# Patient Record
Sex: Female | Born: 1980 | Race: White | Hispanic: No | Marital: Married | Smoking: Never smoker
Health system: Southern US, Community
[De-identification: ages and names within clinical notes are randomized; demographics above are authoritative.]

## PROBLEM LIST (undated history)

## (undated) DIAGNOSIS — F419 Anxiety disorder, unspecified: Secondary | ICD-10-CM

## (undated) DIAGNOSIS — Q513 Bicornate uterus: Secondary | ICD-10-CM

## (undated) DIAGNOSIS — E039 Hypothyroidism, unspecified: Secondary | ICD-10-CM

## (undated) DIAGNOSIS — K219 Gastro-esophageal reflux disease without esophagitis: Secondary | ICD-10-CM

## (undated) HISTORY — DX: Bicornate uterus: Q51.3

## (undated) HISTORY — DX: Anxiety disorder, unspecified: F41.9

## (undated) HISTORY — PX: WISDOM TOOTH EXTRACTION: SHX21

## (undated) HISTORY — DX: Hypothyroidism, unspecified: E03.9

---

## 2006-02-13 ENCOUNTER — Inpatient Hospital Stay (HOSPITAL_COMMUNITY): Admission: AD | Admit: 2006-02-13 | Discharge: 2006-02-13 | Payer: Self-pay | Admitting: Obstetrics and Gynecology

## 2006-05-04 ENCOUNTER — Inpatient Hospital Stay (HOSPITAL_COMMUNITY): Admission: AD | Admit: 2006-05-04 | Discharge: 2006-05-04 | Payer: Self-pay | Admitting: Obstetrics and Gynecology

## 2006-05-11 ENCOUNTER — Inpatient Hospital Stay (HOSPITAL_COMMUNITY): Admission: AD | Admit: 2006-05-11 | Discharge: 2006-05-14 | Payer: Self-pay | Admitting: Obstetrics and Gynecology

## 2009-05-31 ENCOUNTER — Inpatient Hospital Stay (HOSPITAL_COMMUNITY): Admission: AD | Admit: 2009-05-31 | Discharge: 2009-06-01 | Payer: Self-pay | Admitting: Obstetrics and Gynecology

## 2009-06-06 ENCOUNTER — Ambulatory Visit (HOSPITAL_COMMUNITY): Admission: RE | Admit: 2009-06-06 | Discharge: 2009-06-06 | Payer: Self-pay | Admitting: Anesthesiology

## 2010-04-09 LAB — RH IMMUNE GLOB WKUP(>/=20WKS)(NOT WOMEN'S HOSP): Fetal Screen: NEGATIVE

## 2010-04-09 LAB — CBC
HCT: 35.3 % — ABNORMAL LOW (ref 36.0–46.0)
HCT: 40.8 % (ref 36.0–46.0)
Hemoglobin: 12.2 g/dL (ref 12.0–15.0)
Hemoglobin: 14.1 g/dL (ref 12.0–15.0)
MCHC: 34.6 g/dL (ref 30.0–36.0)
MCV: 91.3 fL (ref 78.0–100.0)
MCV: 91.9 fL (ref 78.0–100.0)
RBC: 4.47 MIL/uL (ref 3.87–5.11)
RDW: 13.8 % (ref 11.5–15.5)
WBC: 14.1 10*3/uL — ABNORMAL HIGH (ref 4.0–10.5)

## 2010-04-09 LAB — RPR: RPR Ser Ql: NONREACTIVE

## 2010-06-07 NOTE — Discharge Summary (Signed)
NAME:  Jessica Velez, Jessica Velez            ACCOUNT NO.:  [id number]   MEDICAL RECORD NO.:  [id number]          PATIENT TYPE:  INP   LOCATION:  9115                          FACILITY:  [facility]   PHYSICIAN:  Zenaida Niece, M.D.DATE OF BIRTH:  05-20-1980   DATE OF ADMISSION:  05/11/2006  DATE OF DISCHARGE:                               DISCHARGE SUMMARY   ADMISSION DIAGNOSES:  1. Intrauterine intrauterine pregnancy at 37+ weeks.  2. Group B streptococcus carrier.  3. 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  4. Hypothyroidism.   DISCHARGE DIAGNOSES:  1. Intrauterine intrauterine pregnancy at 37+ weeks.  2. Group B streptococcus carrier.  3. 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  4. Hypothyroidism.   PROCEDURES:  On May 12, 2006, she had a spontaneous vaginal delivery.   HISTORY AND PHYSICAL:  This is a 30 year old white female gravida 1,  para 0 with an EGA of 37+ weeks who presents with a complaint of regular  contractions.  Evaluation in triage revealed cervix to be 3 cm dilated  and this changed to 4 cm dilated after walking.  Prenatal care  complicated by a possible bicornuate uterus and otherwise uncomplicated.  Prenatal labs:  Blood type is O negative with a negative antibody  screen, RPR nonreactive, rubella immune, hepatitis B surface antigen  negative, gonorrhea and chlamydia negative, AFP is normal, 1-hour  Glucola is 122 and group B strep is positive.  Past medical history  significant for hypothyroidism and anxiety.  Medications: Levothyroxine  112 mcg daily.  Physical exam:  She is afebrile with stable vital signs.  Fetal heart tracing is reactive with regular contractions.  Abdomen  gravid, nontender, with an estimated fetal weight 7 pounds.  Cervix on  my first exam:  She is complete, complete and +3 with a vertex  presentation and an adequate pelvis.   HOSPITAL COURSE:  The patient was admitted and observed and put on  penicillin for group B strep prophylaxis.  Despite continued regular  contractions, her cervix did not change.  She was put on Pitocin  augmentation.  She received an epidural and had spontaneous rupture of  membranes.  She progressed to complete, pushed well, and on the morning  of April22 had a vaginal delivery of a viable female infant with  Apgars of 8 and 9 that weighed 6 pounds 11 ounces.  Placenta delivered  spontaneous, was intact, and was sent for cord blood collection.  She  had a second-degree laceration repaired with 3-0 Vicryl with local  block, and a small right labial laceration which was hemostatic and not  repaired.  The uterus did palpate as a possible bicornuate uterus.  Estimated blood loss was 500 mL.  Postpartum, she had no significant  complications.  Predelivery hemoglobin 13.4, postdelivery is 12, and on  postpartum #2 she was felt to be stable enough for discharge home.   DISCHARGE INSTRUCTIONS:  Regular diet, pelvic rest, followup in 6 weeks.  Medications are over-the-counter ibuprofen as needed, and she is given  our discharge pamphlet.      Zenaida Niece, M.D.  Electronically Signed     TDM/MEDQ  D:  05/14/2006  T:  05/14/2006  Job:  [id number]

## 2010-09-18 ENCOUNTER — Other Ambulatory Visit (HOSPITAL_COMMUNITY): Payer: Self-pay | Admitting: Obstetrics and Gynecology

## 2010-09-18 DIAGNOSIS — O3680X0 Pregnancy with inconclusive fetal viability, not applicable or unspecified: Secondary | ICD-10-CM

## 2010-09-19 ENCOUNTER — Ambulatory Visit (HOSPITAL_COMMUNITY): Payer: Self-pay

## 2010-09-19 ENCOUNTER — Other Ambulatory Visit (HOSPITAL_COMMUNITY): Payer: Self-pay

## 2010-11-04 ENCOUNTER — Other Ambulatory Visit (HOSPITAL_COMMUNITY): Payer: Self-pay | Admitting: Obstetrics and Gynecology

## 2010-11-04 DIAGNOSIS — Z369 Encounter for antenatal screening, unspecified: Secondary | ICD-10-CM

## 2010-11-04 LAB — ABO/RH: RH Type: NEGATIVE

## 2010-11-04 LAB — GC/CHLAMYDIA PROBE AMP, GENITAL: Gonorrhea: NEGATIVE

## 2010-11-04 LAB — RPR: RPR: NONREACTIVE

## 2010-11-04 LAB — HEPATITIS B SURFACE ANTIGEN: Hepatitis B Surface Ag: NEGATIVE

## 2010-11-05 ENCOUNTER — Encounter (HOSPITAL_COMMUNITY): Payer: Self-pay

## 2010-11-05 ENCOUNTER — Ambulatory Visit (HOSPITAL_COMMUNITY)
Admission: RE | Admit: 2010-11-05 | Discharge: 2010-11-05 | Disposition: A | Discharge status: Home / Self Care | Payer: BC Managed Care – PPO | Source: Ambulatory Visit | Attending: Obstetrics and Gynecology | Admitting: Obstetrics and Gynecology

## 2010-11-05 ENCOUNTER — Other Ambulatory Visit: Payer: Self-pay

## 2010-11-05 DIAGNOSIS — O351XX0 Maternal care for (suspected) chromosomal abnormality in fetus, not applicable or unspecified: Secondary | ICD-10-CM | POA: Insufficient documentation

## 2010-11-05 DIAGNOSIS — Z369 Encounter for antenatal screening, unspecified: Secondary | ICD-10-CM

## 2010-11-05 DIAGNOSIS — Z3689 Encounter for other specified antenatal screening: Secondary | ICD-10-CM | POA: Insufficient documentation

## 2010-11-05 DIAGNOSIS — O3510X0 Maternal care for (suspected) chromosomal abnormality in fetus, unspecified, not applicable or unspecified: Secondary | ICD-10-CM | POA: Insufficient documentation

## 2010-11-05 NOTE — Progress Notes (Signed)
Obstetric ultrasound performed today for nuchal translucency.  Please see report in [facility].

## 2011-01-21 NOTE — L&D Delivery Note (Signed)
Delivery Note At 2:28 AM a viable female was delivered via Vaginal, Spontaneous Delivery (Presentation: Right Occiput Anterior).  APGAR: 9, 9; weight P .   Placenta status: Intact, Spontaneous.  Cord: 3 vessels with the following complications: None.    Anesthesia: Local Epidural none Episiotomy: None Lacerations: 1st degree;Labial;2nd degree;Perineal Suture Repair: 3.0 vicryl rapide Est. Blood Loss (mL): 500  Mom to postpartum.  Baby to nursery-stable.  BOVARD,Yanelly Cantrelle 05/03/2011, 3:28 AM  Br/O neg/Vasectomy

## 2011-04-11 LAB — STREP B DNA PROBE: GBS: NEGATIVE

## 2011-04-29 ENCOUNTER — Encounter (HOSPITAL_COMMUNITY): Payer: Self-pay | Admitting: *Deleted

## 2011-04-29 ENCOUNTER — Telehealth (HOSPITAL_COMMUNITY): Payer: Self-pay | Admitting: *Deleted

## 2011-04-29 NOTE — Telephone Encounter (Signed)
Preadmission screen  

## 2011-04-30 ENCOUNTER — Encounter (HOSPITAL_COMMUNITY): Payer: Self-pay | Admitting: *Deleted

## 2011-04-30 ENCOUNTER — Telehealth (HOSPITAL_COMMUNITY): Payer: Self-pay | Admitting: *Deleted

## 2011-04-30 NOTE — Telephone Encounter (Signed)
Preadmission screen  

## 2011-05-02 ENCOUNTER — Inpatient Hospital Stay (HOSPITAL_COMMUNITY)
Admission: AD | Admit: 2011-05-02 | Discharge: 2011-05-04 | DRG: 373 | Disposition: A | Discharge status: Home / Self Care | Payer: BC Managed Care – PPO | Source: Ambulatory Visit | Attending: Obstetrics and Gynecology | Admitting: Obstetrics and Gynecology

## 2011-05-02 DIAGNOSIS — Q513 Bicornate uterus: Secondary | ICD-10-CM | POA: Diagnosis present

## 2011-05-02 DIAGNOSIS — O99284 Endocrine, nutritional and metabolic diseases complicating childbirth: Secondary | ICD-10-CM | POA: Diagnosis present

## 2011-05-02 DIAGNOSIS — E079 Disorder of thyroid, unspecified: Secondary | ICD-10-CM | POA: Diagnosis present

## 2011-05-02 DIAGNOSIS — E039 Hypothyroidism, unspecified: Secondary | ICD-10-CM | POA: Diagnosis present

## 2011-05-02 DIAGNOSIS — O3400 Maternal care for unspecified congenital malformation of uterus, unspecified trimester: Secondary | ICD-10-CM | POA: Diagnosis present

## 2011-05-03 ENCOUNTER — Encounter (HOSPITAL_COMMUNITY): Payer: Self-pay

## 2011-05-03 ENCOUNTER — Encounter (HOSPITAL_COMMUNITY): Payer: Self-pay | Admitting: Anesthesiology

## 2011-05-03 ENCOUNTER — Inpatient Hospital Stay (HOSPITAL_COMMUNITY): Payer: BC Managed Care – PPO | Admitting: Anesthesiology

## 2011-05-03 LAB — CBC
HCT: 41 % (ref 36.0–46.0)
MCV: 90.1 fL (ref 78.0–100.0)
RBC: 4.55 MIL/uL (ref 3.87–5.11)
RDW: 13.9 % (ref 11.5–15.5)
WBC: 10.3 10*3/uL (ref 4.0–10.5)

## 2011-05-03 MED ORDER — FENTANYL 2.5 MCG/ML BUPIVACAINE 1/10 % EPIDURAL INFUSION (WH - ANES)
14.0000 mL/h | INTRAMUSCULAR | Status: DC
  Filled 2011-05-03: qty 60

## 2011-05-03 MED ORDER — BUTORPHANOL TARTRATE 2 MG/ML IJ SOLN: 2.0000 mg | INTRAMUSCULAR | Status: DC | PRN

## 2011-05-03 MED ORDER — TERBUTALINE SULFATE 1 MG/ML IJ SOLN: 0.2500 mg | Freq: Once | INTRAMUSCULAR | Status: DC | PRN

## 2011-05-03 MED ORDER — DIPHENHYDRAMINE HCL 50 MG/ML IJ SOLN: 12.5000 mg | INTRAMUSCULAR | Status: DC | PRN

## 2011-05-03 MED ORDER — CITRIC ACID-SODIUM CITRATE 334-500 MG/5ML PO SOLN: 30.0000 mL | ORAL | Status: DC | PRN

## 2011-05-03 MED ORDER — IBUPROFEN 600 MG PO TABS
600.0000 mg | ORAL_TABLET | Freq: Four times a day (QID) | ORAL | Status: DC
  Administered 2011-05-03 – 2011-05-04 (×5): 600 mg via ORAL
  Filled 2011-05-03 (×5): qty 1

## 2011-05-03 MED ORDER — PHENYLEPHRINE 40 MCG/ML (10ML) SYRINGE FOR IV PUSH (FOR BLOOD PRESSURE SUPPORT): 80.0000 ug | PREFILLED_SYRINGE | INTRAVENOUS | Status: DC | PRN

## 2011-05-03 MED ORDER — ZOLPIDEM TARTRATE 5 MG PO TABS: 5.0000 mg | ORAL_TABLET | Freq: Every evening | ORAL | Status: DC | PRN

## 2011-05-03 MED ORDER — LEVOTHYROXINE SODIUM 112 MCG PO TABS
112.0000 ug | ORAL_TABLET | Freq: Every day | ORAL | Status: DC
  Administered 2011-05-03 – 2011-05-04 (×2): 112 ug via ORAL
  Filled 2011-05-03 (×2): qty 1

## 2011-05-03 MED ORDER — EPHEDRINE 5 MG/ML INJ
10.0000 mg | INTRAVENOUS | Status: DC | PRN
  Filled 2011-05-03: qty 4

## 2011-05-03 MED ORDER — RHO D IMMUNE GLOBULIN 1500 UNIT/2ML IJ SOLN
300.0000 ug | Freq: Once | INTRAMUSCULAR | Status: AC
  Administered 2011-05-03: 300 ug via INTRAMUSCULAR
  Filled 2011-05-03: qty 2

## 2011-05-03 MED ORDER — ACETAMINOPHEN 325 MG PO TABS: 650.0000 mg | ORAL_TABLET | ORAL | Status: DC | PRN

## 2011-05-03 MED ORDER — LIDOCAINE HCL (PF) 1 % IJ SOLN
INTRAMUSCULAR | Status: DC | PRN
  Administered 2011-05-03: 4 mL
  Administered 2011-05-03: 30 mL
  Administered 2011-05-03: 4 mL

## 2011-05-03 MED ORDER — BENZOCAINE-MENTHOL 20-0.5 % EX AERO
INHALATION_SPRAY | CUTANEOUS | Status: AC
  Administered 2011-05-03: 06:00:00
  Filled 2011-05-03: qty 56

## 2011-05-03 MED ORDER — SENNOSIDES-DOCUSATE SODIUM 8.6-50 MG PO TABS
2.0000 | ORAL_TABLET | Freq: Every day | ORAL | Status: DC
  Administered 2011-05-03: 2 via ORAL

## 2011-05-03 MED ORDER — DIBUCAINE 1 % RE OINT: 1.0000 "application " | TOPICAL_OINTMENT | RECTAL | Status: DC | PRN

## 2011-05-03 MED ORDER — SIMETHICONE 80 MG PO CHEW: 80.0000 mg | CHEWABLE_TABLET | ORAL | Status: DC | PRN

## 2011-05-03 MED ORDER — LACTATED RINGERS IV SOLN
500.0000 mL | Freq: Once | INTRAVENOUS | Status: AC
  Administered 2011-05-03: 500 mL via INTRAVENOUS

## 2011-05-03 MED ORDER — OXYTOCIN 20 UNITS IN LACTATED RINGERS INFUSION - SIMPLE: 1.0000 m[IU]/min | INTRAVENOUS | Status: DC

## 2011-05-03 MED ORDER — IBUPROFEN 600 MG PO TABS: 600.0000 mg | ORAL_TABLET | Freq: Four times a day (QID) | ORAL | Status: DC | PRN

## 2011-05-03 MED ORDER — ONDANSETRON HCL 4 MG PO TABS: 4.0000 mg | ORAL_TABLET | ORAL | Status: DC | PRN

## 2011-05-03 MED ORDER — OXYCODONE-ACETAMINOPHEN 5-325 MG PO TABS: 1.0000 | ORAL_TABLET | ORAL | Status: DC | PRN

## 2011-05-03 MED ORDER — LACTATED RINGERS IV SOLN: INTRAVENOUS | Status: DC

## 2011-05-03 MED ORDER — OXYTOCIN BOLUS FROM INFUSION
500.0000 mL | Freq: Once | INTRAVENOUS | Status: AC
  Administered 2011-05-03: 500 mL via INTRAVENOUS
  Filled 2011-05-03: qty 1000
  Filled 2011-05-03: qty 500

## 2011-05-03 MED ORDER — FLEET ENEMA 7-19 GM/118ML RE ENEM: 1.0000 | ENEMA | RECTAL | Status: DC | PRN

## 2011-05-03 MED ORDER — PRENATAL MULTIVITAMIN CH: 1.0000 | ORAL_TABLET | Freq: Every day | ORAL | Status: DC

## 2011-05-03 MED ORDER — LIDOCAINE HCL (PF) 1 % IJ SOLN
30.0000 mL | INTRAMUSCULAR | Status: DC | PRN
  Filled 2011-05-03: qty 30

## 2011-05-03 MED ORDER — FENTANYL 2.5 MCG/ML BUPIVACAINE 1/10 % EPIDURAL INFUSION (WH - ANES)
INTRAMUSCULAR | Status: DC | PRN
  Administered 2011-05-03: 14 mL/h via EPIDURAL

## 2011-05-03 MED ORDER — LANOLIN HYDROUS EX OINT: TOPICAL_OINTMENT | CUTANEOUS | Status: DC | PRN

## 2011-05-03 MED ORDER — WITCH HAZEL-GLYCERIN EX PADS: 1.0000 "application " | MEDICATED_PAD | CUTANEOUS | Status: DC | PRN

## 2011-05-03 MED ORDER — PRENATAL MULTIVITAMIN CH
1.0000 | ORAL_TABLET | Freq: Every day | ORAL | Status: DC
  Administered 2011-05-03 – 2011-05-04 (×2): 1 via ORAL
  Filled 2011-05-03 (×2): qty 1

## 2011-05-03 MED ORDER — BENZOCAINE-MENTHOL 20-0.5 % EX AERO: 1.0000 "application " | INHALATION_SPRAY | CUTANEOUS | Status: DC | PRN

## 2011-05-03 MED ORDER — EPHEDRINE 5 MG/ML INJ: 10.0000 mg | INTRAVENOUS | Status: DC | PRN

## 2011-05-03 MED ORDER — PHENYLEPHRINE 40 MCG/ML (10ML) SYRINGE FOR IV PUSH (FOR BLOOD PRESSURE SUPPORT)
80.0000 ug | PREFILLED_SYRINGE | INTRAVENOUS | Status: DC | PRN
  Filled 2011-05-03 (×2): qty 5

## 2011-05-03 MED ORDER — TETANUS-DIPHTH-ACELL PERTUSSIS 5-2.5-18.5 LF-MCG/0.5 IM SUSP: 0.5000 mL | Freq: Once | INTRAMUSCULAR | Status: DC

## 2011-05-03 MED ORDER — OXYTOCIN 20 UNITS IN LACTATED RINGERS INFUSION - SIMPLE
125.0000 mL/h | Freq: Once | INTRAVENOUS | Status: DC
  Administered 2011-05-03: 125 mL/h via INTRAVENOUS

## 2011-05-03 MED ORDER — DIPHENHYDRAMINE HCL 25 MG PO CAPS: 25.0000 mg | ORAL_CAPSULE | Freq: Four times a day (QID) | ORAL | Status: DC | PRN

## 2011-05-03 MED ORDER — ONDANSETRON HCL 4 MG/2ML IJ SOLN: 4.0000 mg | INTRAMUSCULAR | Status: DC | PRN

## 2011-05-03 MED ORDER — ONDANSETRON HCL 4 MG/2ML IJ SOLN: 4.0000 mg | Freq: Four times a day (QID) | INTRAMUSCULAR | Status: DC | PRN

## 2011-05-03 MED ORDER — LACTATED RINGERS IV SOLN
INTRAVENOUS | Status: DC
  Administered 2011-05-03: 01:00:00 via INTRAVENOUS

## 2011-05-03 MED ORDER — LACTATED RINGERS IV SOLN
500.0000 mL | INTRAVENOUS | Status: DC | PRN
Start: 2011-05-03 — End: 2011-05-03

## 2011-05-03 NOTE — Progress Notes (Signed)
Post Partum Day 0 Subjective: no complaints, up ad lib, tolerating PO and nll lochia, pain controlled  Objective: Blood pressure 106/66, pulse 78, temperature 98.1 F (36.7 C), temperature source Oral, resp. rate 20, height 5' 4.25" (1.632 m), weight 81.251 kg (179 lb 2 oz), last menstrual period 07/27/2010, SpO2 96.00%, not currently breastfeeding.  Physical Exam:  General: alert and no distress Lochia: appropriate Uterine Fundus: firm   Basename 05/03/11 0030  HGB 13.9  HCT 41.0    Assessment/Plan: Plan for discharge tomorrow, Breastfeeding and Lactation consult routine care   LOS: 1 day   BOVARD,Terril Amaro 05/03/2011, 11:07 AM

## 2011-05-03 NOTE — Anesthesia Postprocedure Evaluation (Signed)
  Anesthesia Post-op Note  Patient: Jessica Velez  Procedure(s) Performed: * No procedures listed *  Patient Location: PACU and Mother/Baby  Anesthesia Type: Epidural  Level of Consciousness: awake, alert  and oriented  Airway and Oxygen Therapy: Patient Spontanous Breathing   Post-op Assessment: Patient's Cardiovascular Status Stable and Respiratory Function Stable  Post-op Vital Signs: stable  Complications: No apparent anesthesia complications

## 2011-05-03 NOTE — Anesthesia Procedure Notes (Signed)
Epidural Patient location during procedure: OB Start time: 05/03/2011 1:47 AM  Staffing Anesthesiologist: Maressa Apollo A. Performed by: anesthesiologist   Preanesthetic Checklist Completed: patient identified, site marked, surgical consent, pre-op evaluation, timeout performed, IV checked, risks and benefits discussed and monitors and equipment checked  Epidural Patient position: sitting Prep: site prepped and draped and DuraPrep Patient monitoring: continuous pulse ox and blood pressure Approach: midline Injection technique: LOR air  Needle:  Needle type: Tuohy  Needle gauge: 17 G Needle length: 9 cm Needle insertion depth: 5 cm cm Catheter type: closed end flexible Catheter size: 19 Gauge Catheter at skin depth: 10 cm Test dose: negative and Other  Assessment Events: blood not aspirated, injection not painful, no injection resistance, negative IV test and no paresthesia  Additional Notes Patient identified. Risks and benefits discussed including failed block, incomplete  Pain control, post dural puncture headache, nerve damage, paralysis, blood pressure Changes, nausea, vomiting, reactions to medications-both toxic and allergic and post Partum back pain. All questions were answered. Patient expressed understanding and wished to proceed. Sterile technique was used throughout procedure. Epidural site was Dressed with sterile barrier dressing. No paresthesias, signs of intravascular injection Or signs of intrathecal spread were encountered.  Patient was more comfortable after the epidural was dosed. Please see RN's note for documentation of vital signs and FHR which are stable.

## 2011-05-03 NOTE — MAU Note (Signed)
Pt states, " I've had a lot of pressure for the last four hours and a trickle since 11:15 pm which was green. I started having a few contractions at 10:30 and they have gotten a little more intense."

## 2011-05-03 NOTE — Anesthesia Preprocedure Evaluation (Signed)
Anesthesia Evaluation  Patient identified by MRN, date of birth, ID band Patient awake    Reviewed: Allergy & Precautions, H&P , Patient's Chart, lab work & pertinent test results  Airway Mallampati: III TM Distance: >3 FB Neck ROM: full    Dental No notable dental hx. (+) Teeth Intact   Pulmonary neg pulmonary ROS,  breath sounds clear to auscultation  Pulmonary exam normal       Cardiovascular negative cardio ROS  Rhythm:regular Rate:Normal     Neuro/Psych Anxiety negative neurological ROS     GI/Hepatic negative GI ROS, Neg liver ROS,   Endo/Other  Hypothyroidism   Renal/GU negative Renal ROS  negative genitourinary   Musculoskeletal   Abdominal Normal abdominal exam  (+)   Peds  Hematology negative hematology ROS (+)   Anesthesia Other Findings   Reproductive/Obstetrics (+) Pregnancy Bicornuate uterus                           Anesthesia Physical Anesthesia Plan  ASA: II  Anesthesia Plan: Epidural   Post-op Pain Management:    Induction:   Airway Management Planned:   Additional Equipment:   Intra-op Plan:   Post-operative Plan:   Informed Consent: I have reviewed the patients History and Physical, chart, labs and discussed the procedure including the risks, benefits and alternatives for the proposed anesthesia with the patient or authorized representative who has indicated his/her understanding and acceptance.     Plan Discussed with: Anesthesiologist  Anesthesia Plan Comments:         Anesthesia Quick Evaluation

## 2011-05-03 NOTE — H&P (Signed)
Queen Abbett is a 31 y.o. female [phone] at 38+ presents with SROM for thick meconium.  +FM, no VB, ctx increasing in intensity and frequency.  Uncomplicated prenatal care Maternal Medical History:  Reason for admission: Reason for admission: rupture of membranes.  Contractions: Onset was 3-5 hours ago.   Frequency: regular.    Fetal activity: Perceived fetal activity is normal.      OB History    Grav Para Term Preterm Abortions TAB SAB Ect Mult Living   4 3 2 1 1  0 1 0 0 3    G1 female SVD 6#11, G2 SAB, G3 7#5 SVD female, present; no abn pap, no STDs Past Medical History  Diagnosis Date  . Hypothyroidism   . Bicornate uterus   . Anxiety   . SVD (spontaneous vaginal delivery) 05/03/2011   Past Surgical History  Procedure Date  . No past surgeries    Family History: family history includes Cancer in her maternal grandfather; Heart disease in her maternal grandfather and paternal grandmother; Hypertension in her maternal grandfather; Seizures in her maternal grandfather; Stroke in her paternal grandfather; and Thyroid disease in her mother and paternal grandmother. Social History:  reports that she has never smoked. She has never used smokeless tobacco. She reports that she does not drink alcohol or use illicit drugs.married Meds levothyroxine, PNV All NKDA  Review of Systems  Constitutional: Negative.   HENT: Negative.   Eyes: Negative.   Respiratory: Negative.   Cardiovascular: Negative.   Gastrointestinal: Negative.   Genitourinary: Negative.   Musculoskeletal: Negative.   Skin: Negative.   Neurological: Negative.   Psychiatric/Behavioral: Negative.     Dilation: 4 Effacement (%): 80 Station: -1;0 Exam by:: l.poore, rn Blood pressure 120/71, pulse 76, temperature 98.1 F (36.7 C), temperature source Oral, resp. rate 20, height 5' 4.25" (1.632 m), weight 81.251 kg (179 lb 2 oz), last menstrual period 07/27/2010, SpO2 100.00%, not currently breastfeeding. Maternal  Exam:  Abdomen: Fundal height is appropriate for gestation.   Estimated fetal weight is 7#.   Fetal presentation: vertex     Physical Exam  Constitutional: She is oriented to person, place, and time. She appears well-developed and well-nourished.  HENT:  Head: Normocephalic and atraumatic.  Neck: Normal range of motion. Neck supple. No thyromegaly present.  Cardiovascular: Normal rate and regular rhythm.   Respiratory: Effort normal and breath sounds normal. No respiratory distress.  GI: Soft. Bowel sounds are normal. There is no tenderness.  Musculoskeletal: Normal range of motion.  Neurological: She is alert and oriented to person, place, and time.  Skin: Skin is warm and dry.  Psychiatric: She has a normal mood and affect. Her behavior is normal.    Prenatal labs: ABO, Rh: O/Negative/-- (10/15 0000) Antibody: Negative (10/15 0000) Rubella: Immune (10/15 0000) RPR: Nonreactive (10/15 0000)  HBsAg: Negative (10/15 0000)  HIV: Non-reactive (10/15 0000)  GBS: Negative (03/22 0000)  Hgb 13.7/Pap WNL/ Plt 222k/ TSH WNL/ GC neg/ Chl neg/ CF neg/ First Tri Screen and AFP WNL/ glucola 99/ gbbs neg  [place] 6wk dates pregnacy, sm subchorionic hmg  18wk nl anat x EIF, female, post plac Assessment/Plan: 30yo [id number] at 38+ with SROM Epidural for comfort Expect SVD   BOVARD,Cielo Arias 05/03/2011, 3:03 AM

## 2011-05-04 ENCOUNTER — Inpatient Hospital Stay (HOSPITAL_COMMUNITY): Admission: RE | Admit: 2011-05-04 | Payer: BC Managed Care – PPO | Source: Ambulatory Visit

## 2011-05-04 LAB — CBC
Hemoglobin: 12.4 g/dL (ref 12.0–15.0)
MCH: 30 pg (ref 26.0–34.0)
MCHC: 32.5 g/dL (ref 30.0–36.0)
MCV: 92.3 fL (ref 78.0–100.0)
RBC: 4.13 MIL/uL (ref 3.87–5.11)

## 2011-05-04 LAB — RH IG WORKUP (INCLUDES ABO/RH)
Fetal Screen: NEGATIVE
Gestational Age(Wks): 38.6
Unit division: 0

## 2011-05-04 MED ORDER — PRENATAL MULTIVITAMIN CH: 1.0000 | ORAL_TABLET | Freq: Every day | ORAL | Status: DC

## 2011-05-04 MED ORDER — IBUPROFEN 800 MG PO TABS: 800.0000 mg | ORAL_TABLET | Freq: Three times a day (TID) | ORAL | Status: AC | PRN

## 2011-05-04 NOTE — Progress Notes (Signed)
Post Partum Day 1 Subjective: no complaints, tolerating PO and nl lochai, pain controlled  Objective: Blood pressure 110/72, pulse 84, temperature 98.2 F (36.8 C), temperature source Oral, resp. rate 18, height 5' 4.25" (1.632 m), weight 81.251 kg (179 lb 2 oz), last menstrual period 07/27/2010, SpO2 96.00%, not currently breastfeeding.  Physical Exam:  General: alert and no distress Lochia: appropriate Uterine Fundus: firm    Basename 05/04/11 0521 05/03/11 0030  HGB 12.4 13.9  HCT 38.1 41.0    Assessment/Plan: Discharge home, Breastfeeding and Lactation consult  D/C with Motrin/PNV, f/u 6 wks   LOS: 2 days   BOVARD,Sherril Heyward 05/04/2011, 9:53 AM

## 2011-05-04 NOTE — Discharge Summary (Signed)
Obstetric Discharge Summary Reason for Admission: onset of labor Prenatal Procedures: none Intrapartum Procedures: spontaneous vaginal delivery Postpartum Procedures: none Complications-Operative and Postpartum: 2nd degree perineal laceration Hemoglobin  Date Value Range Status  05/04/2011 12.4  12.0-15.0 (g/dL) Final     HCT  Date Value Range Status  05/04/2011 38.1  36.0-46.0 (%) Final    Physical Exam:  General: alert and no distress Lochia: appropriate Uterine Fundus: firm  Discharge Diagnoses: Term Pregnancy-delivered  Discharge Information: Date: 05/04/2011 Activity: pelvic rest Diet: routine Medications: PNV and Ibuprofen Condition: stable Instructions: refer to practice specific booklet Discharge to: home Follow-up Information    Follow up with BOVARD,Jessica Zaccone, MD. Schedule an appointment as soon as possible for a visit in 6 weeks.   Contact information:   [address] [facility] [address] [place] [address] [phone]          Newborn Data: Live born female  Birth Weight: 6 lb 12.5 oz (3075 g) APGAR: 9, 9  Home with mother.  BOVARD,Jessica Velez 05/04/2011, 10:01 AM

## 2011-11-22 ENCOUNTER — Ambulatory Visit: Payer: Self-pay | Admitting: Internal Medicine

## 2011-11-22 LAB — URINALYSIS, COMPLETE
Glucose,UR: NEGATIVE mg/dL (ref 0–75)
Ketone: NEGATIVE
Nitrite: NEGATIVE
Specific Gravity: 1.005 (ref 1.003–1.030)

## 2011-11-24 LAB — URINE CULTURE

## 2011-12-10 ENCOUNTER — Ambulatory Visit: Payer: Self-pay | Admitting: Family Medicine

## 2013-11-21 ENCOUNTER — Encounter (HOSPITAL_COMMUNITY): Payer: Self-pay

## 2013-11-22 IMAGING — US THYROID ULTRASOUND
1 series · 14 of 25 positions shown · non-contrast
Comparison: none

REASON FOR EXAM: thyroid nodule
COMMENTS:

[Series 1: thyroid ultrasound · 0.08mm/px · 14 of 37 slices shown]
[im 1/37]
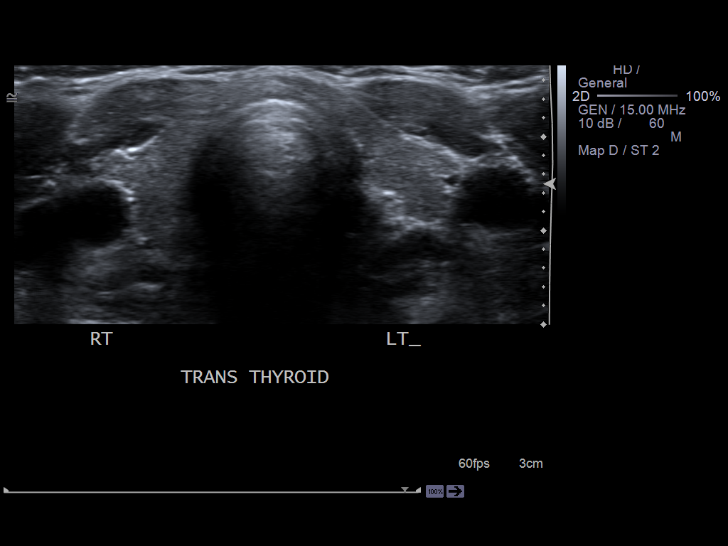
[im 4/37]
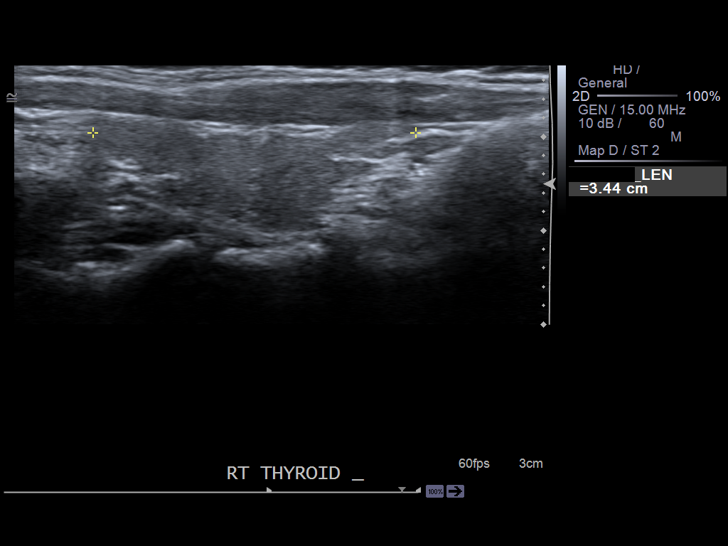
[im 7/37]
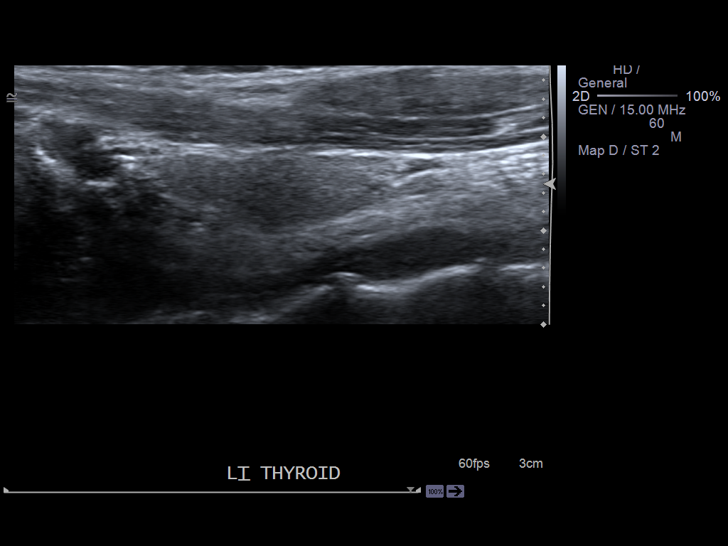
[im 10/37]
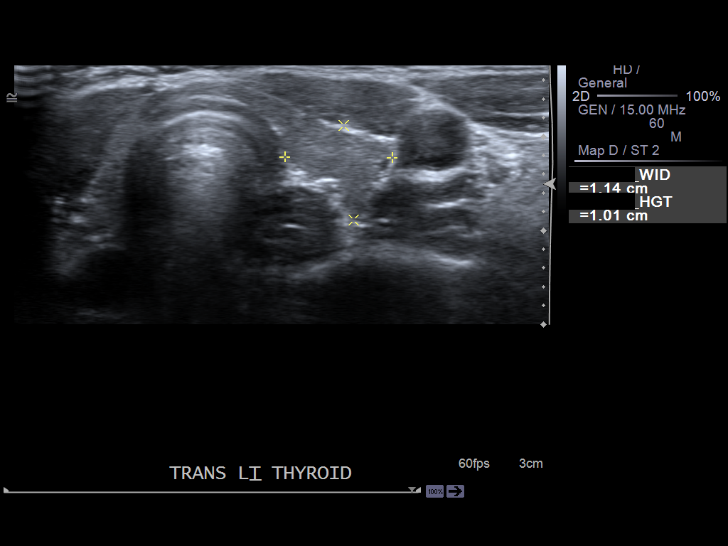
[im 13/37]
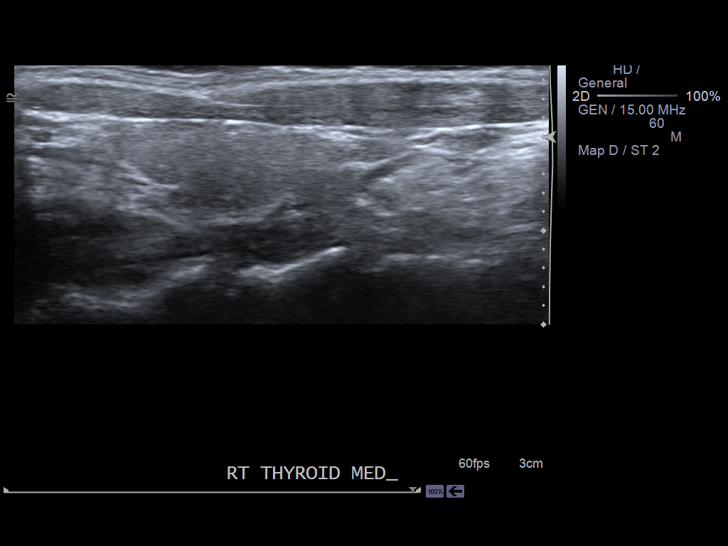
[im 14/37]
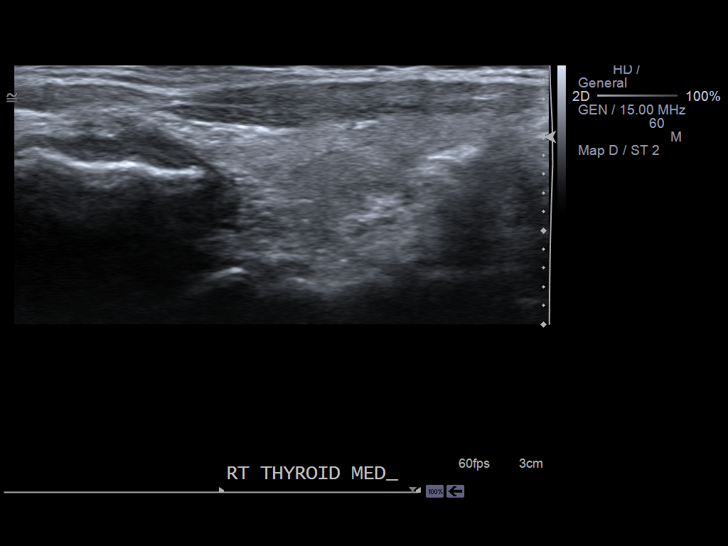
[im 17/37]
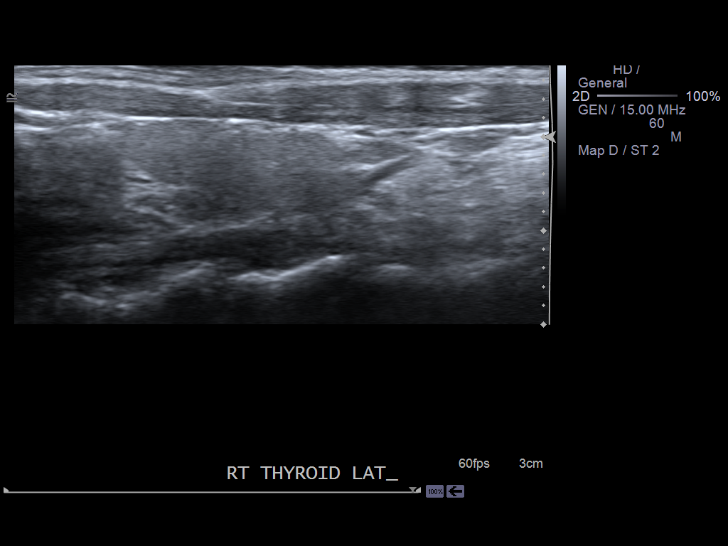
[im 20/37]
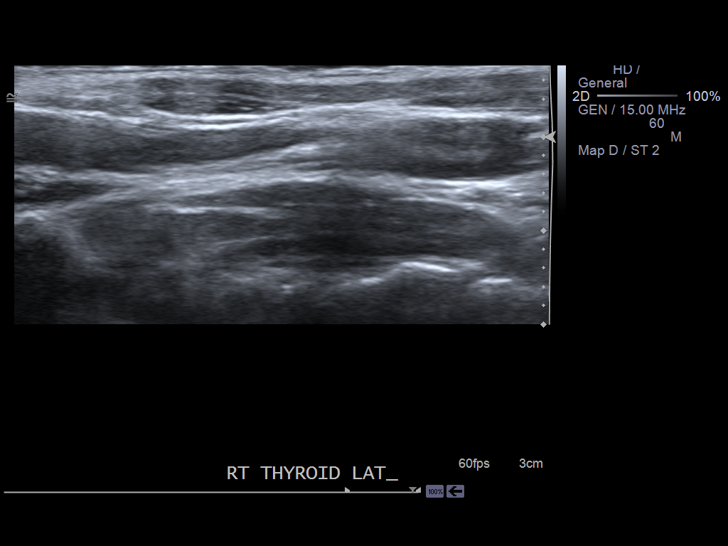
[im 23/37]
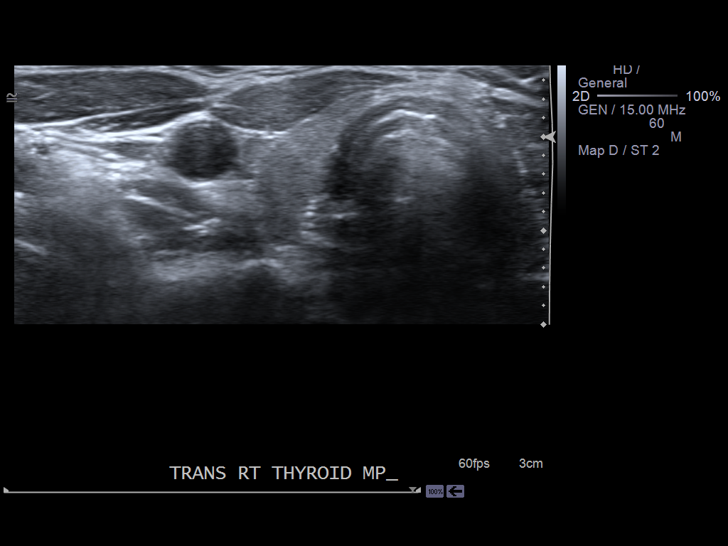
[im 25/37]
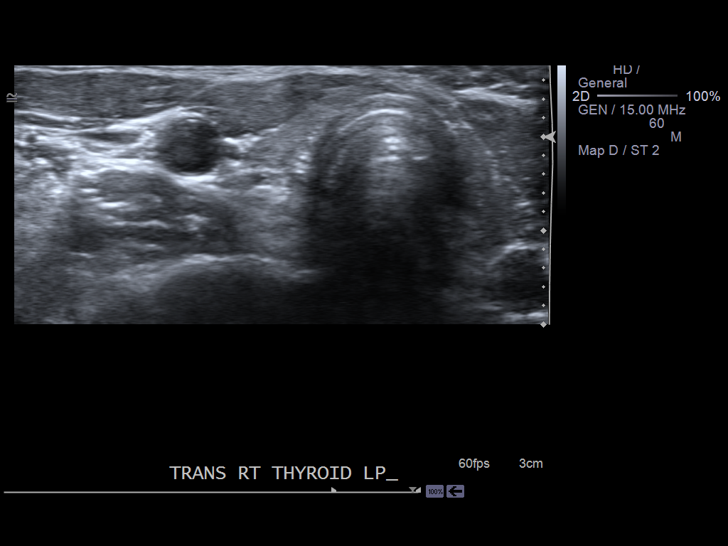
[im 28/37]
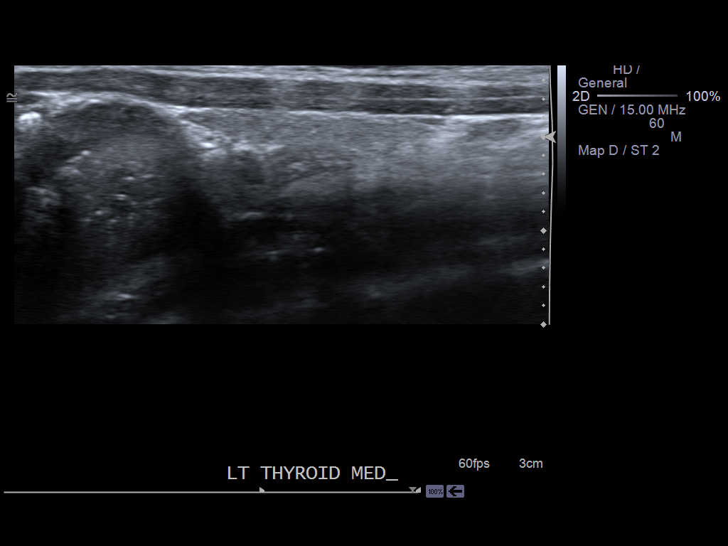
[im 31/37]
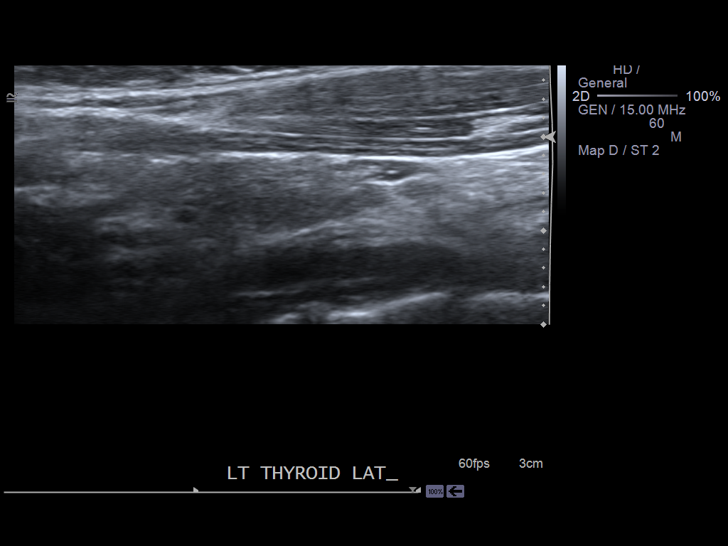
[im 34/37]
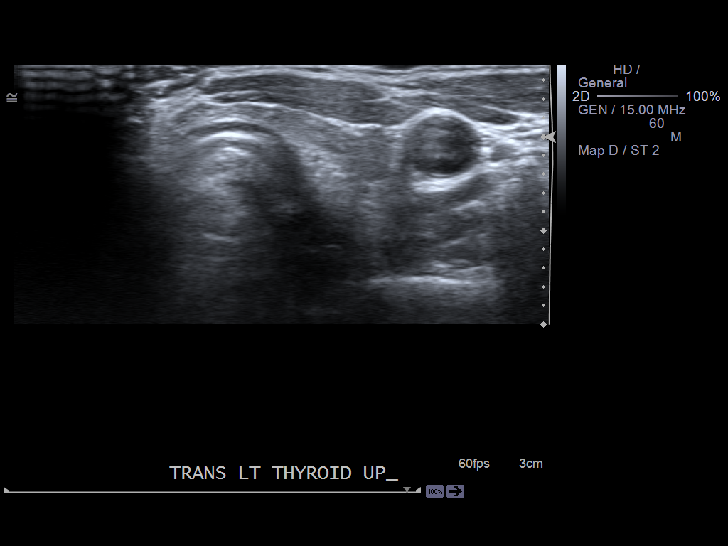
[im 37/37]
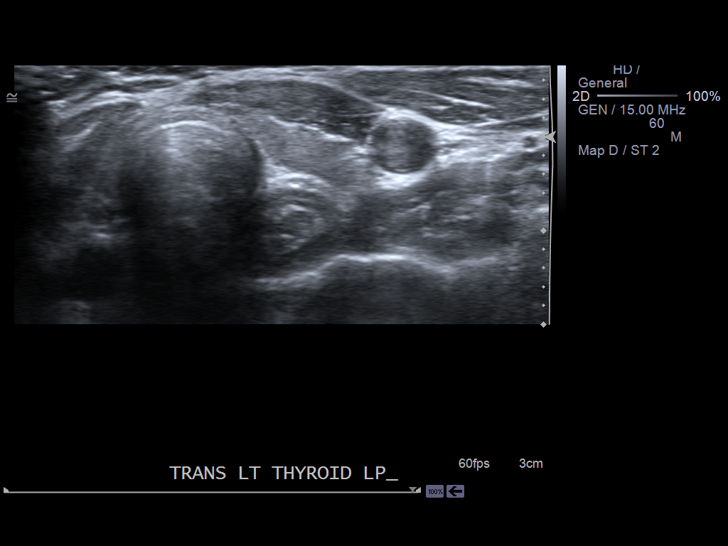

[14 of 25 positions shown; findings below may reference images not displayed]

PROCEDURE:     US  - US SOFT TISSUE HEAD/NECK/THYROID  - December 10, 2011  [DATE]

RESULT:     Thyroid ultrasound is performed. The thyroid measures 3.44 x
0.96 x 1.27 cm on the right lobe by 2.99 x 1.14 x 1.01 cm in the left lobe.
The isthmus shows a thickness of 0.29 cm anterior to posterior. No thyroid
nodules or calcifications are seen. No cysts are present.
IMPRESSION: Normal appearing thyroid sonogram. No evidence of a focal
mass or nodule. No cysts evident.

[REDACTED]

## 2016-11-24 ENCOUNTER — Encounter
Admission: RE | Admit: 2016-11-24 | Discharge: 2016-11-24 | Disposition: A | Discharge status: Home / Self Care | Payer: Self-pay | Source: Ambulatory Visit | Attending: Otolaryngology | Admitting: Otolaryngology

## 2016-11-24 ENCOUNTER — Encounter: Payer: Self-pay | Admitting: *Deleted

## 2016-11-24 HISTORY — DX: Gastro-esophageal reflux disease without esophagitis: K21.9

## 2016-11-24 NOTE — Patient Instructions (Signed)
  Your procedure is scheduled on: 12-01-16 Report to Same Day Surgery 2nd floor medical mall [facility] Entrance-take elevator on left to 2nd floor.  Check in with surgery information desk.) To find out your arrival time please call [phone] between 1PM - 3PM on 11-28-16  Remember: Instructions that are not followed completely may result in serious medical risk, up to and including death, or upon the discretion of your surgeon and anesthesiologist your surgery may need to be rescheduled.    _x___ 1. Do not eat food after midnight the night before your procedure. NO GUM CHEWING OR CANDY AFTER MIDNIGHT.  You may drink clear liquids up to 2 hours before you are scheduled to arrive at the hospital for your procedure.  Do not drink clear liquids within 2 hours of your scheduled arrival to the hospital.  Clear liquids include  --Water or Apple juice without pulp  --Clear carbohydrate beverage such as ClearFast or Gatorade  --Black Coffee or Clear Tea (No milk, no creamers, do not add anything to  the coffee or Tea      __x__ 2. No Alcohol for 24 hours before or after surgery.   __x__3. No Smoking for 24 prior to surgery.   ____  4. Bring all medications with you on the day of surgery if instructed.    __x__ 5. Notify your doctor if there is any change in your medical condition     (cold, fever, infections).     Do not wear jewelry, make-up, hairpins, clips or nail polish.  Do not wear lotions, powders, or perfumes. You may wear deodorant.  Do not shave 48 hours prior to surgery. Men may shave face and neck.  Do not bring valuables to the hospital.    [facility] is not responsible for any belongings or valuables.               Contacts, dentures or bridgework may not be worn into surgery.  Leave your suitcase in the car. After surgery it may be brought to your room.  For patients admitted to the hospital, discharge time is determined by your treatment team.   Patients discharged  the day of surgery will not be allowed to drive home.  You will need someone to drive you home and stay with you the night of your procedure.    Please read over the following fact sheets that you were given:     _x___ TAKE THE FOLLOWING MEDICATION THE MORNING OF SURGERY WITH A SMALL SIP OF WATER. These include:  1. LEVOTHYROXINE  2.  3.  4.  5.  6.  ____Fleets enema or Magnesium Citrate as directed.   ____ Use CHG Soap or sage wipes as directed on instruction sheet   ____ Use inhalers on the day of surgery and bring to hospital day of surgery  ____ Stop Metformin and Janumet 2 days prior to surgery.    ____ Take 1/2 of usual insulin dose the night before surgery and none on the morning surgery.   ____ Follow recommendations from Cardiologist, Pulmonologist or PCP regarding stopping Aspirin, Coumadin, Plavix ,Eliquis, Effient, or Pradaxa, and Pletal.  X____Stop Anti-inflammatories such as Advil, Aleve, Ibuprofen, Motrin, Naproxen, Naprosyn, Goodies powders or aspirin products NOW- OK to take Tylenol    ____ Stop supplements until after surgery.   ____ Bring C-Pap to the hospital.

## 2016-12-01 ENCOUNTER — Ambulatory Visit
Admission: RE | Admit: 2016-12-01 | Discharge: 2016-12-01 | Disposition: A | Discharge status: Home / Self Care | Payer: 59 | Source: Ambulatory Visit | Attending: Otolaryngology | Admitting: Otolaryngology

## 2016-12-01 ENCOUNTER — Ambulatory Visit: Payer: 59 | Admitting: Anesthesiology

## 2016-12-01 ENCOUNTER — Encounter
Admission: RE | Disposition: A | Discharge status: Home / Self Care | Payer: Self-pay | Source: Ambulatory Visit | Attending: Otolaryngology

## 2016-12-01 ENCOUNTER — Encounter: Payer: Self-pay | Admitting: *Deleted

## 2016-12-01 DIAGNOSIS — E039 Hypothyroidism, unspecified: Secondary | ICD-10-CM | POA: Insufficient documentation

## 2016-12-01 DIAGNOSIS — Q892 Congenital malformations of other endocrine glands: Secondary | ICD-10-CM | POA: Diagnosis present

## 2016-12-01 HISTORY — PX: THYROGLOSSAL DUCT CYST: SHX297

## 2016-12-01 LAB — POCT PREGNANCY, URINE: PREG TEST UR: NEGATIVE

## 2016-12-01 SURGERY — EXCISION, THYROGLOSSAL DUCT CYST
Anesthesia: General

## 2016-12-01 MED ORDER — FENTANYL CITRATE (PF) 100 MCG/2ML IJ SOLN
INTRAMUSCULAR | Status: AC
  Filled 2016-12-01: qty 2

## 2016-12-01 MED ORDER — LACTATED RINGERS IV SOLN
INTRAVENOUS | Status: DC
  Administered 2016-12-01: 07:00:00 via INTRAVENOUS

## 2016-12-01 MED ORDER — PHENYLEPHRINE HCL 10 MG/ML IJ SOLN
INTRAMUSCULAR | Status: AC
  Filled 2016-12-01: qty 1

## 2016-12-01 MED ORDER — ONDANSETRON HCL 4 MG/2ML IJ SOLN
INTRAMUSCULAR | Status: AC
  Filled 2016-12-01: qty 2

## 2016-12-01 MED ORDER — MIDAZOLAM HCL 2 MG/2ML IJ SOLN
INTRAMUSCULAR | Status: AC
  Filled 2016-12-01: qty 2

## 2016-12-01 MED ORDER — MIDAZOLAM HCL 2 MG/2ML IJ SOLN
INTRAMUSCULAR | Status: DC | PRN
  Administered 2016-12-01: 2 mg via INTRAVENOUS

## 2016-12-01 MED ORDER — LIDOCAINE-EPINEPHRINE (PF) 1 %-1:200000 IJ SOLN
INTRAMUSCULAR | Status: AC
  Filled 2016-12-01: qty 30

## 2016-12-01 MED ORDER — DEXAMETHASONE SODIUM PHOSPHATE 10 MG/ML IJ SOLN
INTRAMUSCULAR | Status: AC
  Filled 2016-12-01: qty 1

## 2016-12-01 MED ORDER — PROPOFOL 10 MG/ML IV BOLUS
INTRAVENOUS | Status: AC
  Filled 2016-12-01: qty 20

## 2016-12-01 MED ORDER — REMIFENTANIL HCL 1 MG IV SOLR
INTRAVENOUS | Status: DC | PRN
  Administered 2016-12-01: .08 ug/kg/min via INTRAVENOUS

## 2016-12-01 MED ORDER — ONDANSETRON HCL 4 MG/2ML IJ SOLN
INTRAMUSCULAR | Status: DC | PRN
  Administered 2016-12-01: 4 mg via INTRAVENOUS

## 2016-12-01 MED ORDER — FENTANYL CITRATE (PF) 100 MCG/2ML IJ SOLN
INTRAMUSCULAR | Status: DC | PRN
  Administered 2016-12-01: 100 ug via INTRAVENOUS

## 2016-12-01 MED ORDER — ROCURONIUM BROMIDE 50 MG/5ML IV SOLN
INTRAVENOUS | Status: AC
  Filled 2016-12-01: qty 1

## 2016-12-01 MED ORDER — ONDANSETRON HCL 4 MG/2ML IJ SOLN
4.0000 mg | Freq: Once | INTRAMUSCULAR | Status: AC | PRN
  Administered 2016-12-01: 4 mg via INTRAVENOUS

## 2016-12-01 MED ORDER — SUCCINYLCHOLINE CHLORIDE 20 MG/ML IJ SOLN
INTRAMUSCULAR | Status: DC | PRN
  Administered 2016-12-01: 80 mg via INTRAVENOUS

## 2016-12-01 MED ORDER — DEXAMETHASONE SODIUM PHOSPHATE 10 MG/ML IJ SOLN
INTRAMUSCULAR | Status: DC | PRN
  Administered 2016-12-01: 10 mg via INTRAVENOUS

## 2016-12-01 MED ORDER — SUCCINYLCHOLINE CHLORIDE 20 MG/ML IJ SOLN
INTRAMUSCULAR | Status: AC
  Filled 2016-12-01: qty 1

## 2016-12-01 MED ORDER — FENTANYL CITRATE (PF) 100 MCG/2ML IJ SOLN
25.0000 ug | INTRAMUSCULAR | Status: DC | PRN
  Administered 2016-12-01 (×2): 25 ug via INTRAVENOUS

## 2016-12-01 MED ORDER — PROPOFOL 10 MG/ML IV BOLUS
INTRAVENOUS | Status: DC | PRN
Start: 2016-12-01 — End: 2016-12-01
  Administered 2016-12-01: 140 mg via INTRAVENOUS

## 2016-12-01 MED ORDER — LIDOCAINE HCL (CARDIAC) 20 MG/ML IV SOLN
INTRAVENOUS | Status: DC | PRN
Start: 2016-12-01 — End: 2016-12-01
  Administered 2016-12-01: 50 mg via INTRAVENOUS

## 2016-12-01 MED ORDER — BACITRACIN ZINC 500 UNIT/GM EX OINT
TOPICAL_OINTMENT | CUTANEOUS | Status: AC
  Filled 2016-12-01: qty 28.35

## 2016-12-01 MED ORDER — LIDOCAINE HCL (PF) 2 % IJ SOLN
INTRAMUSCULAR | Status: AC
  Filled 2016-12-01: qty 10

## 2016-12-01 MED ORDER — FAMOTIDINE 20 MG PO TABS
20.0000 mg | ORAL_TABLET | Freq: Once | ORAL | Status: AC
  Administered 2016-12-01: 20 mg via ORAL

## 2016-12-01 MED ORDER — BACITRACIN 500 UNIT/GM EX OINT
TOPICAL_OINTMENT | CUTANEOUS | Status: DC | PRN
  Administered 2016-12-01: 1 via TOPICAL

## 2016-12-01 MED ORDER — REMIFENTANIL HCL 1 MG IV SOLR
INTRAVENOUS | Status: AC
  Filled 2016-12-01: qty 1000

## 2016-12-01 MED ORDER — ONDANSETRON HCL 4 MG/2ML IJ SOLN
INTRAMUSCULAR | Status: AC
  Administered 2016-12-01: 4 mg via INTRAVENOUS
  Filled 2016-12-01: qty 2

## 2016-12-01 MED ORDER — LIDOCAINE-EPINEPHRINE (PF) 1 %-1:200000 IJ SOLN
INTRAMUSCULAR | Status: DC | PRN
  Administered 2016-12-01: 4 mL

## 2016-12-01 MED ORDER — TRAMADOL HCL 50 MG PO TABS
ORAL_TABLET | ORAL | Status: AC
  Administered 2016-12-01: 50 mg
  Filled 2016-12-01: qty 1

## 2016-12-01 MED ORDER — FENTANYL CITRATE (PF) 100 MCG/2ML IJ SOLN
INTRAMUSCULAR | Status: AC
  Administered 2016-12-01: 25 ug via INTRAVENOUS
  Filled 2016-12-01: qty 2

## 2016-12-01 MED ORDER — SUGAMMADEX SODIUM 200 MG/2ML IV SOLN
INTRAVENOUS | Status: AC
  Filled 2016-12-01: qty 2

## 2016-12-01 MED ORDER — SUGAMMADEX SODIUM 200 MG/2ML IV SOLN
INTRAVENOUS | Status: DC | PRN
  Administered 2016-12-01: 150 mg via INTRAVENOUS

## 2016-12-01 MED ORDER — FAMOTIDINE 20 MG PO TABS
ORAL_TABLET | ORAL | Status: AC
  Administered 2016-12-01: 20 mg via ORAL
  Filled 2016-12-01: qty 1

## 2016-12-01 MED ORDER — ROCURONIUM BROMIDE 100 MG/10ML IV SOLN
INTRAVENOUS | Status: DC | PRN
  Administered 2016-12-01 (×2): 20 mg via INTRAVENOUS

## 2016-12-01 SURGICAL SUPPLY — 38 items
BLADE SURG 15 STRL LF DISP TIS (BLADE) ×1 IMPLANT
BLADE SURG 15 STRL SS (BLADE) ×3
CANISTER SUCT 1200ML W/VALVE (MISCELLANEOUS) ×3 IMPLANT
CLOSURE WOUND 1/2 X4 (GAUZE/BANDAGES/DRESSINGS) ×1
CORD BIP STRL DISP 12FT (MISCELLANEOUS) ×3 IMPLANT
DRAIN TLS ROUND 10FR (DRAIN) IMPLANT
DRAPE MAG INST 16X20 L/F (DRAPES) ×3 IMPLANT
DRSG TELFA 3X8 NADH (GAUZE/BANDAGES/DRESSINGS) ×3 IMPLANT
ELECT CAUTERY BLADE TIP 2.5 (TIP) ×3
ELECT NEEDLE 20X.3 GREEN (MISCELLANEOUS)
ELECT REM PT RETURN 9FT ADLT (ELECTROSURGICAL) ×3
ELECTRODE CAUTERY BLDE TIP 2.5 (TIP) ×1 IMPLANT
ELECTRODE NEEDLE 20X.3 GREEN (MISCELLANEOUS) IMPLANT
ELECTRODE REM PT RTRN 9FT ADLT (ELECTROSURGICAL) ×1 IMPLANT
FORCEPS JEWEL BIP 4-3/4 STR (INSTRUMENTS) ×3 IMPLANT
GAUZE SPONGE 4X4 12PLY STRL (GAUZE/BANDAGES/DRESSINGS) ×3 IMPLANT
GLOVE BIO SURGEON STRL SZ7.5 (GLOVE) ×3 IMPLANT
GLOVE PROTEXIS LATEX SZ 7.5 (GLOVE) ×3 IMPLANT
GLOVE SURG LATEX 7.5 PF (GLOVE) ×1 IMPLANT
GOWN STRL REUS W/ TWL LRG LVL3 (GOWN DISPOSABLE) ×3 IMPLANT
GOWN STRL REUS W/TWL LRG LVL3 (GOWN DISPOSABLE) ×9
HOOK STAY BLUNT/RETRACTOR 5M (MISCELLANEOUS) ×1 IMPLANT
LABEL OR SOLS (LABEL) IMPLANT
NEEDLE HYPO 27GX1-1/4 (NEEDLE) ×3 IMPLANT
NS IRRIG 500ML POUR BTL (IV SOLUTION) ×3 IMPLANT
PACK HEAD/NECK (MISCELLANEOUS) ×3 IMPLANT
PAD DRESSING TELFA 3X8 NADH (GAUZE/BANDAGES/DRESSINGS) ×1 IMPLANT
PROBE MONO 100X0.75 ELECT 1.9M (MISCELLANEOUS) ×1 IMPLANT
SHEARS HARMONIC 9CM CVD (BLADE) ×3 IMPLANT
SPONGE KITTNER 5P (MISCELLANEOUS) ×3 IMPLANT
SPONGE XRAY 4X4 16PLY STRL (MISCELLANEOUS) ×3 IMPLANT
STRIP CLOSURE SKIN 1/2X4 (GAUZE/BANDAGES/DRESSINGS) ×2 IMPLANT
SUT ETHILON 5-0 FS-2 18 BLK (SUTURE) ×3 IMPLANT
SUT PROLENE 6 0 PC 1 (SUTURE) ×2 IMPLANT
SUT SILK 2 0 (SUTURE) ×3
SUT SILK 2-0 18XBRD TIE 12 (SUTURE) ×1 IMPLANT
SUT VIC AB 4-0 RB1 18 (SUTURE) ×3 IMPLANT
SYSTEM CHEST DRAIN TLS 7FR (DRAIN) ×1 IMPLANT

## 2016-12-01 NOTE — Anesthesia Postprocedure Evaluation (Signed)
Anesthesia Post Note  Patient: Barbette Orlizabeth M Everetts  Procedure(s) Performed: Dewitt HoesHYROGLOSSAL DUCT CYST (N/A )  Patient location during evaluation: PACU Anesthesia Type: General Level of consciousness: awake and alert Pain management: pain level controlled Vital Signs Assessment: post-procedure vital signs reviewed and stable Respiratory status: spontaneous breathing, nonlabored ventilation, respiratory function stable and patient connected to nasal cannula oxygen Cardiovascular status: blood pressure returned to baseline and stable Postop Assessment: no apparent nausea or vomiting Anesthetic complications: no     Last Vitals:  Vitals:   12/01/16 1013 12/01/16 1044  BP: 117/71 126/69  Pulse: 65 66  Resp: 12 12  Temp:    SpO2: 100% 100%    Last Pain:  Vitals:   12/01/16 1044  TempSrc:   PainSc: 3                  Alvilda Mckenna S

## 2016-12-01 NOTE — Anesthesia Procedure Notes (Signed)
Procedure Name: Intubation Date/Time: 12/01/2016 7:27 AM Performed by: Jonna Clark, CRNA Pre-anesthesia Checklist: Patient identified, Patient being monitored, Timeout performed, Emergency Drugs available and Suction available Patient Re-evaluated:Patient Re-evaluated prior to induction Oxygen Delivery Method: Circle system utilized Preoxygenation: Pre-oxygenation with 100% oxygen Induction Type: IV induction Ventilation: Mask ventilation without difficulty Laryngoscope Size: Mac and 3 Grade View: Grade I Tube type: Oral Tube size: 7.0 mm Number of attempts: 1 Placement Confirmation: ETT inserted through vocal cords under direct vision,  positive ETCO2 and breath sounds checked- equal and bilateral Secured at: 22 cm Tube secured with: Tape Dental Injury: Teeth and Oropharynx as per pre-operative assessment

## 2016-12-01 NOTE — Discharge Instructions (Addendum)
General Anesthesia, Adult, Care After °These instructions provide you with information about caring for yourself after your procedure. Your health care provider may also give you more specific instructions. Your treatment has been planned according to current medical practices, but problems sometimes occur. Call your health care provider if you have any problems or questions after your procedure. °What can I expect after the procedure? °After the procedure, it is common to have: °· Vomiting. °· A sore throat. °· Mental slowness. ° °It is common to feel: °· Nauseous. °· Cold or shivery. °· Sleepy. °· Tired. °· Sore or achy, even in parts of your body where you did not have surgery. ° °Follow these instructions at home: °For at least 24 hours after the procedure: °· Do not: °? Participate in activities where you could fall or become injured. °? Drive. °? Use heavy machinery. °? Drink alcohol. °? Take sleeping pills or medicines that cause drowsiness. °? Make important decisions or sign legal documents. °? Take care of children on your own. °· Rest. °Eating and drinking °· If you vomit, drink water, juice, or soup when you can drink without vomiting. °· Drink enough fluid to keep your urine clear or pale yellow. °· Make sure you have little or no nausea before eating solid foods. °· Follow the diet recommended by your health care provider. °General instructions °· Have a responsible adult stay with you until you are awake and alert. °· Return to your normal activities as told by your health care provider. Ask your health care provider what activities are safe for you. °· Take over-the-counter and prescription medicines only as told by your health care provider. °· If you smoke, do not smoke without supervision. °· Keep all follow-up visits as told by your health care provider. This is important. °Contact a health care provider if: °· You continue to have nausea or vomiting at home, and medicines are not helpful. °· You  cannot drink fluids or start eating again. °· You cannot urinate after 8-12 hours. °· You develop a skin rash. °· You have fever. °· You have increasing redness at the site of your procedure. °Get help right away if: °· You have difficulty breathing. °· You have chest pain. °· You have unexpected bleeding. °· You feel that you are having a life-threatening or urgent problem. °This information is not intended to replace advice given to you by your health care provider. Make sure you discuss any questions you have with your health care provider. °Document Released: 04/14/2000 Document Revised: 06/11/2015 Document Reviewed: 12/21/2014 °Elsevier Interactive Patient Education © 2018 [employer] ° °

## 2016-12-01 NOTE — H&P (Signed)
H&P has been reviewedand patient reevaluated,  and no changes necessary. To be downloaded later.  

## 2016-12-01 NOTE — Anesthesia Preprocedure Evaluation (Signed)
Anesthesia Evaluation  Patient identified by MRN, date of birth, ID band Patient awake    Reviewed: Allergy & Precautions, NPO status , Patient's Chart, lab work & pertinent test results, reviewed documented beta blocker date and time   Airway Mallampati: II  TM Distance: >3 FB     Dental  (+) Chipped   Pulmonary           Cardiovascular      Neuro/Psych Anxiety    GI/Hepatic GERD  Controlled,  Endo/Other  Hypothyroidism   Renal/GU      Musculoskeletal   Abdominal   Peds  Hematology   Anesthesia Other Findings   Reproductive/Obstetrics                             Anesthesia Physical Anesthesia Plan  ASA: II  Anesthesia Plan: General   Post-op Pain Management:    Induction: Intravenous  PONV Risk Score and Plan:   Airway Management Planned: Oral ETT  Additional Equipment:   Intra-op Plan:   Post-operative Plan:   Informed Consent: I have reviewed the patients History and Physical, chart, labs and discussed the procedure including the risks, benefits and alternatives for the proposed anesthesia with the patient or authorized representative who has indicated his/her understanding and acceptance.     Plan Discussed with: CRNA  Anesthesia Plan Comments:         Anesthesia Quick Evaluation

## 2016-12-01 NOTE — Op Note (Signed)
12/01/2016  8:33 AM    Jessica Velez, Jessica Velez  [id number]   Pre-Op Dx:  Thyroglossal duct cyst  Post-op Dx: Thyroglossal duct cyst  Proc: Excision thyroglossal duct cyst   Surg:  Jessica Velez,Jessica Velez H     Assistant: Linus SalmonsMcQueen, Chapman  Anes:  GOT  EBL:  20 mL  Comp:  None  Findings:  Large fluid-filled sac with clear creamy fluid. Attached to hyoid bone  Procedure: The patient was brought to the operating room placed in a supine position. She was given general anesthesia by oral endotracheal intubation. A shoulder roll was placed in the neck extended some. A very large cyst was evident in the anterior neck in the midline overlying the hyoid bone. A skin crease was previously marked overlying the cyst and then 4 mL of 1% lidocaine mixed with epinephrine 1:100,000 was used for infiltration the skin overlying this. She was prepped and draped sterile fashion.  A horizontal incision was created through the skin and subcutaneous following the previously marked skin crease line. There is very thin platysma layer midline and some of this was cut across with the Harmonic scalpel. This showed a cyst beneath it that was approximately 4 cm in diameter. It was freed up superficially and then dissection was carried around its inferior border to free it in the preepiglottic space. The lateral walls were freed up until it was attached only to the midline of the hyoid bone. While is been freed up a small hole polyps in its anterior wall much the liquid leaked out and was suction away. Allis clamp was then placed onto the hyoid bone to help stabilize it and pull it downward. Electrocautery was used to free up the muscles really to it and superior to it. The hyoid bone was freed up and had the cyst attached to its inferior border. Using a large clip the hyoid bone was cut on both sides of the midline attachment and remaining muscle attachments were freed up. This remove the thyroglossal duct cyst and the midline of the  bone were was attached to. There was no evidence of a tract more medial to it through the tongue musculature. A clamp was placed across the tongue base beneath the hyoid bone and then the tissue was separated. A 2-0 silk suture ligature was then placed at the tongue base to tie this area off. There was no significant bleeding. The wound was irrigated copiously and was dry.  The platysmal layer was then closed with a 4-0 Vicryl. The dermis was then closed with 4-0 Vicryl as well. The skin edges were then held in apposition with a 6-0 Prolene in a running locking suture. The skin edge was then covered with a small amount of bacitracin, Telfa, and a Tegaderm dressing. The patient was awakened and taken to the recovery room in satisfactory condition. there were no operative complications.  Dispo:   To PACU to be discharged home  Plan:  To follow-up in the office in 1 week for suture removal. She will rest at home and can slowly increase her diet as tolerated. We'll give her tramadol for pain since she gets nauseated from codeine.  Jessica Velez H  12/01/2016 8:33 AM

## 2016-12-01 NOTE — Anesthesia Post-op Follow-up Note (Signed)
Anesthesia QCDR form completed.        

## 2016-12-01 NOTE — Transfer of Care (Signed)
Immediate Anesthesia Transfer of Care Note  Patient: Jessica Velez  Procedure(s) Performed: Dewitt HoesHYROGLOSSAL DUCT CYST (N/A )  Patient Location: PACU  Anesthesia Type:General  Level of Consciousness: awake, alert  and oriented  Airway & Oxygen Therapy: Patient Spontanous Breathing and Patient connected to face mask oxygen  Post-op Assessment: Report given to RN and Post -op Vital signs reviewed and stable  Post vital signs: Reviewed and stable  Last Vitals:  Vitals:   12/01/16 0629 12/01/16 0841  BP: (!) 143/80 125/69  Pulse: 79   Resp: 18   Temp: (!) 36.1 C (!) 36.3 C  SpO2: 100%     Last Pain:  Vitals:   12/01/16 0629  TempSrc: Tympanic         Complications: No apparent anesthesia complications

## 2016-12-02 LAB — SURGICAL PATHOLOGY

## 2017-02-22 ENCOUNTER — Encounter: Payer: Self-pay | Admitting: Gynecology

## 2017-02-22 ENCOUNTER — Ambulatory Visit
Admission: EM | Admit: 2017-02-22 | Discharge: 2017-02-22 | Disposition: A | Discharge status: Home / Self Care | Payer: 59 | Attending: Emergency Medicine | Admitting: Emergency Medicine

## 2017-02-22 ENCOUNTER — Other Ambulatory Visit: Payer: Self-pay

## 2017-02-22 DIAGNOSIS — J020 Streptococcal pharyngitis: Secondary | ICD-10-CM

## 2017-02-22 LAB — RAPID STREP SCREEN (MED CTR MEBANE ONLY): Streptococcus, Group A Screen (Direct): POSITIVE — AB

## 2017-02-22 MED ORDER — DEXAMETHASONE SODIUM PHOSPHATE 10 MG/ML IJ SOLN
10.0000 mg | Freq: Once | INTRAMUSCULAR | Status: AC
  Administered 2017-02-22: 10 mg via INTRAMUSCULAR

## 2017-02-22 MED ORDER — PENICILLIN G BENZATHINE 1200000 UNIT/2ML IM SUSP
1.2000 10*6.[IU] | Freq: Once | INTRAMUSCULAR | Status: AC
  Administered 2017-02-22: 1.2 10*6.[IU] via INTRAMUSCULAR

## 2017-02-22 NOTE — Discharge Instructions (Signed)
1 gram of Tylenol and 600 mg ibuprofen together 3-4 times a day as needed for pain.  Make sure you drink plenty of extra fluids.  Some people find salt water gargles and  Traditional Medicinal's "Throat Coat" tea helpful. Take 5 mL of liquid Benadryl and 5 mL of Maalox. Mix it together, and then hold it in your mouth for as long as you can and then swallow. You may do this 4 times a day.   ° °Go to www.goodrx.com to look up your medications. This will give you a list of where you can find your prescriptions at the most affordable prices. Or ask the pharmacist what the cash price is, or if they have any other discount programs available to help make your medication more affordable. This can be less expensive than what you would pay with insurance.  ° °

## 2017-02-22 NOTE — ED Provider Notes (Signed)
HPI  SUBJECTIVE:  Patient reports sore throat starting 2 days ago. Sx worse with swallowing.  Sx better with ibuprofen, Tylenol. Has been taking TheraFlu w/ o relief.  No fever    + Congestion, rhinorrhea, postnasal drip.  No cough  + Myalgias + Headache No Rash     + Recent Strep Exposure. 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  no Drooling No Trismus No abx in past month.  + antipyretic in past 4-6 hrs -took ibuprofen  She has a past medical history of mono, recurrent strep pharyngitis while in high school.  No history of diabetes, hypertension LMP: Mid January.  Denies possibility being pregnant. PCP: Titus Mould, NP   Past Medical History:  Diagnosis Date  . Anxiety   . Bicornate uterus   . GERD (gastroesophageal reflux disease)    ONLY DURING PREGNANCY  . Hypothyroidism   . SVD (spontaneous vaginal delivery) 05/03/2011    Past Surgical History:  Procedure Laterality Date  . THYROGLOSSAL DUCT CYST N/A 12/01/2016   Procedure: THYROGLOSSAL DUCT CYST;  Surgeon: Vernie Murders, MD;  Location: [facility] ORS;  Service: ENT;  Laterality: N/A;  . WISDOM TOOTH EXTRACTION      Family History  Problem Relation Age of Onset  . Thyroid disease Mother   . Heart disease Maternal Grandfather   . Hypertension Maternal Grandfather   . Seizures Maternal Grandfather   . Cancer Maternal Grandfather        testicular  . Heart disease Paternal Grandmother   . Thyroid disease Paternal Grandmother   . Stroke Paternal Grandfather     Social History   Tobacco Use  . Smoking status: Never Smoker  . Smokeless tobacco: Never Used  Substance Use Topics  . Alcohol use: Yes    Comment: WINE EVERY NIGHT  . Drug use: No    No current facility-administered medications for this encounter.   Current Outpatient  Medications:  .  acetaminophen (TYLENOL) 500 MG tablet, Take 500-1,000 mg by mouth daily as needed for moderate pain or headache., Disp: , Rfl:  .  levothyroxine (SYNTHROID, LEVOTHROID) 125 MCG tablet, Take 125 mcg by mouth daily before breakfast., Disp: , Rfl:  .  Multiple Vitamin (MULTI-VITAMIN DAILY PO), Take by mouth.  , Disp: , Rfl:   Allergies  Allergen Reactions  . Codeine Other (See Comments)    GI UPSET     ROS  As noted in HPI.   Physical Exam  BP 107/74 (BP Location: Left Arm)   Pulse 77   Temp 98.7 F (37.1 C) (Oral)   Resp 16   Ht 5\' 4"  (1.626 m)   Wt 150 lb (68 kg)   LMP 02/05/2017   SpO2 99%   BMI 25.75 kg/m   Constitutional: Well developed, well nourished, no acute distress Eyes:  EOMI, conjunctiva normal bilaterally HENT: Normocephalic, atraumatic,mucus membranes moist.  - nasal congestion +erythematous oropharynx +  enlarged tonsils + exudates. + Petechiae on palate uvula midline.  Respiratory: Normal inspiratory effort Cardiovascular: Normal rate, no murmurs, rubs, gallops GI: nondistended, nontender. No appreciable splenomegaly skin: No rash, skin intact Lymph: + cervical LN  Musculoskeletal: no deformities Neurologic: Alert & oriented x 3, no focal neuro deficits Psychiatric: Speech and behavior appropriate.  ED Course   Medications  penicillin g benzathine (BICILLIN LA) 1200000 UNIT/2ML  injection 1.2 Million Units (1.2 Million Units Intramuscular Given 02/22/17 0858)  dexamethasone (DECADRON) injection 10 mg (10 mg Intramuscular Given 02/22/17 0857)    Orders Placed This Encounter  Procedures  . Rapid strep screen    Standing Status:   Standing    Number of Occurrences:   1    Results for orders placed or performed during the hospital encounter of 02/22/17 (from the past 24 hour(s))  Rapid strep screen     Status: Abnormal   Collection Time: 02/22/17  8:28 AM  Result Value Ref Range   Streptococcus, Group A Screen (Direct) POSITIVE (A)  NEGATIVE   No results found.  ED Clinical Impression  Strep pharyngitis   ED Assessment/Plan  Rapid strep positive.  Giving 1,200,000 units of Bicillin and 10 mg of Decadron IM... Home with ibuprofen, Tylenol, Benadryl/Maalox mixture. Patient to followup with PMD when necessary, and will go to the ER if she gets worse.  Discussed labs,  MDM, plan and followup with patient. Discussed sn/sx that should prompt return to the ED. patient agrees with plan.   Meds ordered this encounter  Medications  . penicillin g benzathine (BICILLIN LA) 1200000 UNIT/2ML injection 1.2 Million Units  . dexamethasone (DECADRON) injection 10 mg     *This clinic note was created using Scientist, clinical (histocompatibility and immunogenetics)Dragon dictation software. Therefore, there may be occasional mistakes despite careful proofreading.    Jessica Velez, Jessica Mccuiston, MD 02/22/17 [phone]

## 2017-02-22 NOTE — ED Triage Notes (Signed)
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
